# Patient Record
Sex: Female | Born: 1955 | Race: Black or African American | Hispanic: No | Marital: Single | Smoking: Never smoker
Health system: Southern US, Community
[De-identification: ages and names within clinical notes are randomized; demographics above are authoritative.]

## PROBLEM LIST (undated history)

## (undated) DIAGNOSIS — E669 Obesity, unspecified: Secondary | ICD-10-CM

## (undated) HISTORY — PX: KNEE SURGERY: SHX244

---

## 2008-09-22 ENCOUNTER — Emergency Department (HOSPITAL_COMMUNITY): Admission: EM | Admit: 2008-09-22 | Discharge: 2008-09-22 | Payer: Self-pay | Admitting: Emergency Medicine

## 2018-05-09 ENCOUNTER — Emergency Department (HOSPITAL_COMMUNITY)
Admission: EM | Admit: 2018-05-09 | Discharge: 2018-05-09 | Disposition: A | Discharge status: Home / Self Care | Payer: Managed Care, Other (non HMO) | Attending: Emergency Medicine | Admitting: Emergency Medicine

## 2018-05-09 ENCOUNTER — Encounter (HOSPITAL_COMMUNITY): Payer: Self-pay | Admitting: *Deleted

## 2018-05-09 ENCOUNTER — Other Ambulatory Visit: Payer: Self-pay

## 2018-05-09 DIAGNOSIS — Z23 Encounter for immunization: Secondary | ICD-10-CM | POA: Diagnosis not present

## 2018-05-09 DIAGNOSIS — T63461A Toxic effect of venom of wasps, accidental (unintentional), initial encounter: Secondary | ICD-10-CM | POA: Diagnosis present

## 2018-05-09 DIAGNOSIS — T63441A Toxic effect of venom of bees, accidental (unintentional), initial encounter: Secondary | ICD-10-CM

## 2018-05-09 HISTORY — DX: Obesity, unspecified: E66.9

## 2018-05-09 MED ORDER — AMOXICILLIN-POT CLAVULANATE 875-125 MG PO TABS: 1.0000 | ORAL_TABLET | Freq: Two times a day (BID) | ORAL | 0 refills | Status: AC

## 2018-05-09 MED ORDER — TETANUS-DIPHTH-ACELL PERTUSSIS 5-2.5-18.5 LF-MCG/0.5 IM SUSP
0.5000 mL | Freq: Once | INTRAMUSCULAR | Status: AC
  Administered 2018-05-09: 0.5 mL via INTRAMUSCULAR
  Filled 2018-05-09: qty 0.5

## 2018-05-09 NOTE — ED Triage Notes (Signed)
Pt reports that she was mowing her lawn when she hit a jellow jackets nest and she was then stung on right eyelid.  Pt denies any changes in her vision.  Some swelling noted to right eye.  No sob with this.

## 2018-05-09 NOTE — Discharge Instructions (Signed)
Use the Augmentin only if your eye becomes hot, red, swollen and far more painful or you have fever, shaking chills. Contact a health care provider if: Your symptoms do not get better in 2-3 days. You have redness, swelling, or pain that spreads beyond the area of the sting. You have a fever. Get help right away if: You have symptoms of a severe allergic reaction. These include: Wheezing or difficulty breathing. Tightness in the chest or chest pain. Light-headedness or fainting. Itchy, raised, red patches on the skin. Nausea or vomiting. Abdominal cramping. Diarrhea. A swollen tongue or lips, or trouble swallowing. Dizziness or fainting.

## 2018-05-09 NOTE — ED Provider Notes (Signed)
[facility] EMERGENCY DEPARTMENT Provider Note   CSN: [id number] Arrival date & time: 05/09/18  1835     History   Chief Complaint Chief Complaint  Patient presents with  . Insect Bite    HPI Amy Malone is a 62 y.o. female who presents to the emergency department for evaluation of yellowjacket sting to the right eye.  Patient states she was mowing her lawn today when she was swarmed by yellow jacket after she ran over the nest.  She was stung on the right eyelid.  She states that she felt something hard along her eyelash line and thought she may have a retained stinger and came to the emergency department for evaluation.  She is unsure of her last tetanus vaccination.  She denies any pain in her eye itself.  She denies pain with eye movement.    HPI  Past Medical History:  Diagnosis Date  . Obesity     There are no active problems to display for this patient.   Past Surgical History:  Procedure Laterality Date  . KNEE SURGERY       OB History   None      Home Medications    Prior to Admission medications   Not on File    Family History No family history on file.  Social History Social History   Tobacco Use  . Smoking status: Never Smoker  . Smokeless tobacco: Never Used  Substance Use Topics  . Alcohol use: Yes  . Drug use: Never     Allergies   Patient has no known allergies.   Review of Systems Review of Systems Ten systems reviewed and are negative for acute change, except as noted in the HPI.    Physical Exam Updated Vital Signs BP 136/90 (BP Location: Right Arm)   Pulse (!) 106   Temp 98.2 F (36.8 C) (Oral)   Resp 18   Ht 5\' 6"  (1.676 m)   Wt 108.9 kg   SpO2 96%   BMI 38.74 kg/m   Physical Exam  Constitutional: She is oriented to person, place, and time. She appears well-developed and well-nourished. No distress.  HENT:  Head: Normocephalic and atraumatic.  Eyes: Pupils are equal, round, and reactive  to light. Conjunctivae and EOM are normal. No scleral icterus.  Retained stinger in the right eyelid along the lash margin Full range of motion of the eye without pain visual acuity documented in nursing notes and is at baseline.  Neck: Normal range of motion.  Cardiovascular: Normal rate, regular rhythm and normal heart sounds. Exam reveals no gallop and no friction rub.  No murmur heard. Pulmonary/Chest: Effort normal and breath sounds normal. No respiratory distress.  Abdominal: Soft. Bowel sounds are normal. She exhibits no distension and no mass. There is no tenderness. There is no guarding.  Neurological: She is alert and oriented to person, place, and time.  Skin: Skin is warm and dry. She is not diaphoretic.  Psychiatric: Her behavior is normal.  Nursing note and vitals reviewed.    ED Treatments / Results  Labs (all labs ordered are listed, but only abnormal results are displayed) Labs Reviewed - No data to display  EKG None  Radiology No results found.  Procedures .Foreign Body Removal Date/Time: 05/09/2018 7:27 PM Performed by: Arthor Captain, PA-C Authorized by: Arthor Captain, PA-C  Consent: Verbal consent obtained. Consent given by: patient Patient understanding: patient states understanding of the procedure being performed Body area: eye Location details:  right eyelid Patient cooperative: yes Localization method: visualized Removal mechanism: tweezers. Depth: superficial Complexity: simple 1 objects recovered. Objects recovered: wasp stinger Post-procedure assessment: foreign body removed Patient tolerance: Patient tolerated the procedure well with no immediate complications   (including critical care time)  Medications Ordered in ED Medications  Tdap (BOOSTRIX) injection 0.5 mL (has no administration in time range)     Initial Impression / Assessment and Plan / ED Course  I have reviewed the triage vital signs and the nursing notes.  Pertinent  labs & imaging results that were available during my care of the patient were reviewed by me and considered in my medical decision making (see chart for details).    With wasp sting to the eye.  Stinger removed.  She has minimal swelling and only a little bit of tenderness in the eye.  EOMI without pain.  Her visual acuity is at baseline.  Tetanus vaccination updated.  Patient will be given Augmentin to hold if she should have signs of infection which we personally reviewed and includes worsening pain swelling, pain out of proportion to the initial injury, fever or shaking chills.  Final Clinical Impressions(s) / ED Diagnoses   Final diagnoses:  Bee sting, accidental or unintentional, initial encounter    ED Discharge Orders    None       Arthor Captain, PA-C 05/09/18 1929    Pricilla Loveless, MD 05/10/18 [phone]

## 2018-05-09 NOTE — ED Notes (Signed)
Patient verbalizes understanding of discharge instructions. Opportunity for questioning and answers were provided. Armband removed by staff, pt discharged from ED ambulatory.   

## 2019-10-15 ENCOUNTER — Ambulatory Visit (INDEPENDENT_AMBULATORY_CARE_PROVIDER_SITE_OTHER): Payer: Managed Care, Other (non HMO)

## 2019-10-15 ENCOUNTER — Ambulatory Visit (HOSPITAL_COMMUNITY)
Admission: EM | Admit: 2019-10-15 | Discharge: 2019-10-15 | Disposition: A | Discharge status: Home / Self Care | Payer: Managed Care, Other (non HMO) | Attending: Emergency Medicine | Admitting: Emergency Medicine

## 2019-10-15 ENCOUNTER — Other Ambulatory Visit: Payer: Self-pay

## 2019-10-15 ENCOUNTER — Encounter (HOSPITAL_COMMUNITY): Payer: Self-pay | Admitting: Emergency Medicine

## 2019-10-15 DIAGNOSIS — S92324A Nondisplaced fracture of second metatarsal bone, right foot, initial encounter for closed fracture: Secondary | ICD-10-CM

## 2019-10-15 DIAGNOSIS — S92334A Nondisplaced fracture of third metatarsal bone, right foot, initial encounter for closed fracture: Secondary | ICD-10-CM

## 2019-10-15 MED ORDER — TRAMADOL HCL 50 MG PO TABS: 50.0000 mg | ORAL_TABLET | Freq: Three times a day (TID) | ORAL | 0 refills | Status: AC | PRN

## 2019-10-15 MED ORDER — NAPROXEN 500 MG PO TABS: 500.0000 mg | ORAL_TABLET | Freq: Two times a day (BID) | ORAL | 0 refills | Status: AC

## 2019-10-15 NOTE — Discharge Instructions (Addendum)
You have two metatarsal fractures seen on xray, consistent with where you are experiencing pain.  Ice, elevation, use of ace wrap and the shoe we have provided you - especially if ambulating- to help with pain.  Naproxen twice a day to help with swelling, take with food.  Tramadol for breakthrough pain. May cause drowsiness. Please do not take if driving or drinking alcohol.   Follow up with orthopedics for definitive evaluation.

## 2019-10-15 NOTE — ED Triage Notes (Signed)
Pt states she tripped down the stairs and twisted her R foot. Pt states she cannot put weight on her R foot. Obvious swelling to lower R leg.

## 2019-10-16 ENCOUNTER — Ambulatory Visit: Payer: Managed Care, Other (non HMO) | Admitting: Physician Assistant

## 2019-10-16 ENCOUNTER — Encounter: Payer: Self-pay | Admitting: Physician Assistant

## 2019-10-16 DIAGNOSIS — S92301A Fracture of unspecified metatarsal bone(s), right foot, initial encounter for closed fracture: Secondary | ICD-10-CM | POA: Diagnosis not present

## 2019-10-16 NOTE — ED Provider Notes (Signed)
[facility]    CSN: [id number] Arrival date & time: 10/15/19  1603      History   Chief Complaint Chief Complaint  Patient presents with  . Foot Pain    HPI Amy Malone is a 64 y.o. female.   Amy Malone presents with complaints of right foot pain and swelling after she tripped/ mis-stepped down a step yesterday afternoon. Unable to bear weight on it since due to pain. Denies any previous injury to the foot. No numbness or tingling. Hasn't taken any medications for her pain.    ROS per HPI, negative if not otherwise mentioned.      Past Medical History:  Diagnosis Date  . Obesity     There are no problems to display for this patient.   Past Surgical History:  Procedure Laterality Date  . KNEE SURGERY      OB History   No obstetric history on file.      Home Medications    Prior to Admission medications   Medication Sig Start Date End Date Taking? Authorizing Provider  amoxicillin-clavulanate (AUGMENTIN) 875-125 MG tablet Take 1 tablet by mouth 2 (two) times daily. One po bid x 7 days 05/09/18   Margarita Mail, PA-C  naproxen (NAPROSYN) 500 MG tablet Take 1 tablet (500 mg total) by mouth 2 (two) times daily. 10/15/19   Zigmund Gottron, NP  traMADol (ULTRAM) 50 MG tablet Take 1 tablet (50 mg total) by mouth every 8 (eight) hours as needed. 10/15/19   Zigmund Gottron, NP    Family History History reviewed. No pertinent family history.  Social History Social History   Tobacco Use  . Smoking status: Never Smoker  . Smokeless tobacco: Never Used  Substance Use Topics  . Alcohol use: Yes  . Drug use: Never     Allergies   Patient has no known allergies.   Review of Systems Review of Systems   Physical Exam Triage Vital Signs ED Triage Vitals  Enc Vitals Group     BP 10/15/19 1725 132/90     Pulse Rate 10/15/19 1725 90     Resp 10/15/19 1725 16     Temp 10/15/19 1725 98.2 F (36.8 C)     Temp Source 10/15/19 1725 Oral     SpO2 10/15/19 1725 99 %     Weight --      Height --      Head Circumference --      Peak Flow --      Pain Score 10/15/19 1727 2     Pain Loc --      Pain Edu? --      Excl. in [place]? --    No data found.  Updated Vital Signs BP 132/90 (BP Location: Left Arm)   Pulse 90   Temp 98.2 F (36.8 C) (Oral)   Resp 16   SpO2 99%   Visual Acuity Right Eye Distance:   Left Eye Distance:   Bilateral Distance:    Right Eye Near:   Left Eye Near:    Bilateral Near:     Physical Exam Constitutional:      General: She is not in acute distress.    Appearance: She is well-developed.  Cardiovascular:     Rate and Rhythm: Normal rate.  Pulmonary:     Effort: Pulmonary effort is normal.  Musculoskeletal:     Left ankle: Swelling present. No tenderness. Normal range of motion.     Left foot:  Swelling, tenderness and bony tenderness present.     Comments: Tenderness over proximal 2-4 metatarsals with generalized swelling to foot and ankle; cap refill < 2 seconds ; full ankle ROM; pain with toe ROM  Skin:    General: Skin is warm and dry.  Neurological:     Mental Status: She is alert and oriented to person, place, and time.      UC Treatments / Results  Labs (all labs ordered are listed, but only abnormal results are displayed) Labs Reviewed - No data to display  EKG   Radiology DG Foot Complete Right  Result Date: 10/15/2019 CLINICAL DATA:  Fall down stairs.  Right foot pain EXAM: RIGHT FOOT COMPLETE - 3+ VIEW COMPARISON:  None. FINDINGS: Degenerative changes of the 1st MTP joint with hallux valgus. Soft tissue swelling diffusely throughout the right foot. Fractures noted through the bases of the 2nd and 3rd metatarsals, nondisplaced. No subluxation or dislocation. IMPRESSION: Fractures through the bases of the right 2nd and 3rd metatarsals. Electronically Signed   By: Charlett Nose M.D.   On: 10/15/2019 17:41    Procedures Procedures (including critical care  time)  Medications Ordered in UC Medications - No data to display  Initial Impression / Assessment and Plan / UC Course  I have reviewed the triage vital signs and the nursing notes.  Pertinent labs & imaging results that were available during my care of the patient were reviewed by me and considered in my medical decision making (see chart for details).     2nd and 3rd metatarsal bases with fractures, on xray. Ace wrap and post op shoe applied, crutches provided as patient unable to tolerate weight bearing at this time. Pain management discussed and encouraged ortho follow up. Patient verbalized understanding and agreeable to plan.   Final Clinical Impressions(s) / UC Diagnoses   Final diagnoses:  Closed nondisplaced fracture of second metatarsal bone of right foot, initial encounter  Closed nondisplaced fracture of third metatarsal bone of right foot, initial encounter     Discharge Instructions     You have two metatarsal fractures seen on xray, consistent with where you are experiencing pain.  Ice, elevation, use of ace wrap and the shoe we have provided you - especially if ambulating- to help with pain.  Naproxen twice a day to help with swelling, take with food.  Tramadol for breakthrough pain. May cause drowsiness. Please do not take if driving or drinking alcohol.   Follow up with orthopedics for definitive evaluation.    ED Prescriptions    Medication Sig Dispense Auth. Provider   naproxen (NAPROSYN) 500 MG tablet Take 1 tablet (500 mg total) by mouth 2 (two) times daily. 30 tablet Linus Mako B, NP   traMADol (ULTRAM) 50 MG tablet Take 1 tablet (50 mg total) by mouth every 8 (eight) hours as needed. 10 tablet Georgetta Haber, NP     I have reviewed the PDMP during this encounter.   Georgetta Haber, NP 10/16/19 1228

## 2019-10-16 NOTE — Progress Notes (Signed)
Office Visit Note   Patient: Amy Malone           Date of Birth: [birth year]           MRN: [id number] Visit Date: 10/16/2019              Requested by: No referring provider defined for this encounter. PCP: System, Pcp Not In   Assessment & Plan: Visit Diagnoses:  1. Closed nondisplaced fracture of metatarsal bone of right foot, unspecified metatarsal, initial encounter     Plan: Place her in a cam walker boot heel weightbearing.  Discussed with her that she is not to drive with the boot.  She is to treat the cam walker boot for the postop shoe as it casting her breath ambulating.  Recommend elevation wiggling of toes.  Also ice.  We will place her out of work until next Monday at her request.  She will follow-up with [place] in 2 weeks at that time we will obtain 3 views of the right foot.  Questions encouraged and answered at length.  Follow-Up Instructions: Return in about 2 weeks (around 10/30/2019) for Radiographs.   Orders:  No orders of the defined types were placed in this encounter.  No orders of the defined types were placed in this encounter.     Procedures: No procedures performed   Clinical Data: No additional findings.   Subjective: Chief Complaint  Patient presents with  . Right Foot - Fracture    HPI Amy Malone is a very pleasant 64 year old female were seen for the first time.  She unfortunately sustained a right foot fracture over the weekend.  Patient states she tripped over a threshold in her home yesterday sustained right foot injury.  She went to his: Urgent care where she was found to have second third metatarsal base fractures intra-articular nondisplaced.  She is placed in the postop shoe and told to follow-up with orthopedics.  She denies any other injury.  She did not lose consciousness.  There is no dizziness shortness of breath chest pain time of the injury. Radiographs right foot 3 views dated 10/15/2019 are reviewed personally.  These show  base of the second and third metatarsals fractures with intra-articular involvement.  Slight step-off of the third metatarsal base fracture.  No Lisfranc injuries.  No other acute fractures.  Significant hallux valgus deformity.  Review of Systems Please see HPI otherwise negative or noncontributory.  Objective: Vital Signs: There were no vitals taken for this visit.  Physical Exam General: Well-developed well-nourished female in no acute distress mood and affect appropriate Psych: Alert and oriented x3 Vascular: Right foot dorsal pedal pulse intact.  Right calf supple nontender. Ortho Exam Right foot tenderness over the base second third metatarsals.  Obvious hallux valgus deformity.  No rashes, skin lesions, impending ulcers or erythema.  Positive edema with dorsal aspect of the right foot.  Right ankle good range of motion without pain.  Achilles nontender.  Right calf supple nontender.  Sensation grossly intact throughout right foot light touch. Specialty Comments:  No specialty comments available.  Imaging: DG Foot Complete Right  Result Date: 10/15/2019 CLINICAL DATA:  Fall down stairs.  Right foot pain EXAM: RIGHT FOOT COMPLETE - 3+ VIEW COMPARISON:  None. FINDINGS: Degenerative changes of the 1st MTP joint with hallux valgus. Soft tissue swelling diffusely throughout the right foot. Fractures noted through the bases of the 2nd and 3rd metatarsals, nondisplaced. No subluxation or dislocation. IMPRESSION: Fractures through the bases of  the right 2nd and 3rd metatarsals. Electronically Signed   By: Rolm Baptise M.D.   On: 10/15/2019 17:41     PMFS History: There are no problems to display for this patient.  Past Medical History:  Diagnosis Date  . Obesity     History reviewed. No pertinent family history.  Past Surgical History:  Procedure Laterality Date  . KNEE SURGERY     Social History   Occupational History  . Not on file  Tobacco Use  . Smoking status: Never Smoker    . Smokeless tobacco: Never Used  Substance and Sexual Activity  . Alcohol use: Yes  . Drug use: Never  . Sexual activity: Not on file

## 2019-10-17 ENCOUNTER — Telehealth: Payer: Self-pay | Admitting: Physician Assistant

## 2019-10-17 ENCOUNTER — Encounter: Payer: Self-pay | Admitting: Radiology

## 2019-10-17 NOTE — Telephone Encounter (Signed)
Emailed to given address below.

## 2019-10-17 NOTE — Telephone Encounter (Signed)
Patient called.   She needs her out of work note updated and extended until her next appointment.   Call back: [phone]  Email: [email or web address]

## 2019-10-30 ENCOUNTER — Encounter: Payer: Self-pay | Admitting: Physician Assistant

## 2019-10-30 ENCOUNTER — Ambulatory Visit: Payer: Managed Care, Other (non HMO) | Admitting: Physician Assistant

## 2019-10-30 ENCOUNTER — Ambulatory Visit (INDEPENDENT_AMBULATORY_CARE_PROVIDER_SITE_OTHER): Payer: Managed Care, Other (non HMO)

## 2019-10-30 ENCOUNTER — Other Ambulatory Visit: Payer: Self-pay

## 2019-10-30 DIAGNOSIS — S92301A Fracture of unspecified metatarsal bone(s), right foot, initial encounter for closed fracture: Secondary | ICD-10-CM

## 2019-10-30 NOTE — Progress Notes (Signed)
   Office Visit Note   Patient: Amy Malone           Date of Birth: 1956-05-18           MRN: [id number] Visit Date: 10/30/2019              Requested by: No referring provider defined for this encounter. PCP: System, Pcp Not In   Assessment & Plan: Visit Diagnoses:  1. Closed nondisplaced fracture of metatarsal bone of right foot, unspecified metatarsal, initial encounter     Plan: Continue postop shoe weightbearing as tolerated right foot.  3 views of right foot on return in 1 month.  Did place her half days at work until return in 1 month.  Questions encouraged and answered.  No high impact activities.  Follow-Up Instructions: Return in about 4 weeks (around 11/27/2019) for Radiographs.   Orders:  Orders Placed This Encounter  Procedures  . XR Foot Complete Right   No orders of the defined types were placed in this encounter.     Procedures: No procedures performed   Clinical Data: No additional findings.   Subjective: Chief Complaint  Patient presents with  . Right Foot - Follow-up    HPI Amy Malone returns today just over 2 weeks status post right second and third metatarsal base fractures.  She states she is really had not a lot of pain.  She is in a postop shoe.  She does note that the foot swells whenever she is up on it throughout the day.  No complaints otherwise. Review of Systems Negative for fevers or chills  Objective: Vital Signs: There were no vitals taken for this visit.  Physical Exam General: Well-developed well-nourished female no acute distress. Ortho Exam Right foot tenderness over the second third metatarsal base.  No rashes skin lesions ulcerations.  Minimal edema.  Good range of motion the right ankle with dorsiflexion plantarflexion without pain. Specialty Comments:  No specialty comments available.  Imaging: XR Foot Complete Right  Result Date: 10/30/2019 Right foot 3 views: Second third metatarsal base fractures remain  unchanged in overall position alignment.  Early consolidation changes seen at the second and third metatarsal fractures.  No other acute findings.    PMFS History: There are no problems to display for this patient.  Past Medical History:  Diagnosis Date  . Obesity     History reviewed. No pertinent family history.  Past Surgical History:  Procedure Laterality Date  . KNEE SURGERY     Social History   Occupational History  . Not on file  Tobacco Use  . Smoking status: Never Smoker  . Smokeless tobacco: Never Used  Substance and Sexual Activity  . Alcohol use: Yes  . Drug use: Never  . Sexual activity: Not on file

## 2019-11-27 ENCOUNTER — Other Ambulatory Visit: Payer: Self-pay

## 2019-11-27 ENCOUNTER — Encounter: Payer: Self-pay | Admitting: Physician Assistant

## 2019-11-27 ENCOUNTER — Ambulatory Visit (INDEPENDENT_AMBULATORY_CARE_PROVIDER_SITE_OTHER): Payer: Managed Care, Other (non HMO)

## 2019-11-27 ENCOUNTER — Ambulatory Visit: Payer: Managed Care, Other (non HMO) | Admitting: Physician Assistant

## 2019-11-27 DIAGNOSIS — S92301A Fracture of unspecified metatarsal bone(s), right foot, initial encounter for closed fracture: Secondary | ICD-10-CM | POA: Diagnosis not present

## 2019-11-27 NOTE — Progress Notes (Signed)
   Office Visit Note   Patient: Amy Malone           Date of Birth: Apr 13, 1956           MRN: [id number] Visit Date: 11/27/2019              Requested by: No referring provider defined for this encounter. PCP: System, Pcp Not In   Assessment & Plan: Visit Diagnoses:  1. Closed nondisplaced fracture of metatarsal bone of right foot, unspecified metatarsal, initial encounter     Plan: She will continue to weight-bear in the postop shoe for the next 2 weeks weightbearing as tolerated.  Then transition to a regular shoe.  We will see her back in a month obtain 3 views of her right foot.  Questions encouraged and answered.  She is to avoid any high impact activities or prolonged walking for exercise.  We will keep her out of of work full time only doing half days for the next month.  Follow-Up Instructions: Return in about 4 weeks (around 12/25/2019) for Radiographs.   Orders:  Orders Placed This Encounter  Procedures  . XR Foot Complete Right   No orders of the defined types were placed in this encounter.     Procedures: No procedures performed   Clinical Data: No additional findings.   Subjective: Chief Complaint  Patient presents with  . Right Foot - Follow-up, Fracture    HPI Amy Malone returns today for follow-up of her right foot.  She has now 4 weeks status post closed nondisplaced fracture involving the second third metatarsal proximal shafts.  States overall that her pain is diminishing.  She also notes her swelling has gotten better.  She still using a crutch to ambulate and is in a postop shoe. Review of Systems See HPI  Objective: Vital Signs: There were no vitals taken for this visit.  Physical Exam Constitutional:      Appearance: She is not ill-appearing or diaphoretic.  Neurological:     Mental Status: She is alert and oriented to person, place, and time.  Psychiatric:        Mood and Affect: Mood normal.     Ortho Exam Right foot slight  tenderness over the second third metatarsal shafts proximally.  Slight edema of the right foot.  Right calf supple nontender.  There is no rashes skin lesions ulcerations or impending ulcers. Specialty Comments:  No specialty comments available.  Imaging: XR Foot Complete Right  Result Date: 11/27/2019 Right foot 3 views: Second and third metatarsal shaft fractures remain unchanged in overall position alignment.  Some early signs of consolidation.  No other fractures identified.    PMFS History: There are no problems to display for this patient.  Past Medical History:  Diagnosis Date  . Obesity     History reviewed. No pertinent family history.  Past Surgical History:  Procedure Laterality Date  . KNEE SURGERY     Social History   Occupational History  . Not on file  Tobacco Use  . Smoking status: Never Smoker  . Smokeless tobacco: Never Used  Substance and Sexual Activity  . Alcohol use: Yes  . Drug use: Never  . Sexual activity: Not on file

## 2019-12-25 ENCOUNTER — Ambulatory Visit (INDEPENDENT_AMBULATORY_CARE_PROVIDER_SITE_OTHER): Payer: Managed Care, Other (non HMO)

## 2019-12-25 ENCOUNTER — Encounter: Payer: Self-pay | Admitting: Physician Assistant

## 2019-12-25 ENCOUNTER — Ambulatory Visit: Payer: Managed Care, Other (non HMO) | Admitting: Physician Assistant

## 2019-12-25 ENCOUNTER — Other Ambulatory Visit: Payer: Self-pay

## 2019-12-25 DIAGNOSIS — S92301A Fracture of unspecified metatarsal bone(s), right foot, initial encounter for closed fracture: Secondary | ICD-10-CM

## 2019-12-25 NOTE — Progress Notes (Signed)
   Office Visit Note   Patient: Amy Malone           Date of Birth: 01-12-1956           MRN: [id number] Visit Date: 12/25/2019              Requested by: No referring provider defined for this encounter. PCP: System, Pcp Not In   Assessment & Plan: Visit Diagnoses:  1. Closed nondisplaced fracture of metatarsal bone of right foot, unspecified metatarsal, initial encounter     Plan: Recommend no jumping or running for the next 4 to 6 weeks.  Patient would like repeat radiographs in 4 weeks to see the consolidation to be complete of the second metacarpal base fracture.  She will continue her regular shoe as comfort allows.  Questions were encouraged and answered at length.  Follow-Up Instructions: Return in about 4 weeks (around 01/22/2020).   Orders:  Orders Placed This Encounter  Procedures  . XR Foot Complete Right   No orders of the defined types were placed in this encounter.     Procedures: No procedures performed   Clinical Data: No additional findings.   Subjective: Chief Complaint  Patient presents with  . Right Foot - Follow-up    HPI Amy Malone returns 8 weeks status post right foot second third metatarsal base fractures.  She is trending towards improvement.  She started wearing a regular shoe last week.  Is having no pain some swelling end of the day no. Review of Systems See HPI otherwise negative noncontributory  Objective: Vital Signs: There were no vitals taken for this visit.  Physical Exam Constitutional:      Appearance: She is not ill-appearing or diaphoretic.  Neurological:     Mental Status: She is alert and oriented to person, place, and time.  Psychiatric:        Mood and Affect: Mood normal.     Ortho Exam Right foot dorsal pulse present.  No rashes skin lesions ulcerations erythema or significant edema.  Nontender over the base second third metatarsals.  Positive hallux valgus deformity.  Callus under the second metatarsal  head. Specialty Comments:  No specialty comments available.  Imaging: XR Foot Complete Right  Result Date: 12/25/2019 3 views right foot: Second third metatarsal fractures with good consolidation.  Otherwise no acute findings.  Fractures remain in good position alignment.  Third metatarsal fracture appears well-healed.  Second metatarsal fracture slight lucency.    PMFS History: There are no problems to display for this patient.  Past Medical History:  Diagnosis Date  . Obesity     History reviewed. No pertinent family history.  Past Surgical History:  Procedure Laterality Date  . KNEE SURGERY     Social History   Occupational History  . Not on file  Tobacco Use  . Smoking status: Never Smoker  . Smokeless tobacco: Never Used  Substance and Sexual Activity  . Alcohol use: Yes  . Drug use: Never  . Sexual activity: Not on file

## 2020-01-22 ENCOUNTER — Ambulatory Visit: Payer: Managed Care, Other (non HMO) | Admitting: Physician Assistant

## 2021-03-31 IMAGING — DX DG FOOT COMPLETE 3+V*R*
3 series · 3 of 3 positions shown · non-contrast
Comparison: None.

CLINICAL DATA: Fall down stairs.  Right foot pain

EXAM:
RIGHT FOOT COMPLETE - 3+ VIEW

[foot ap]
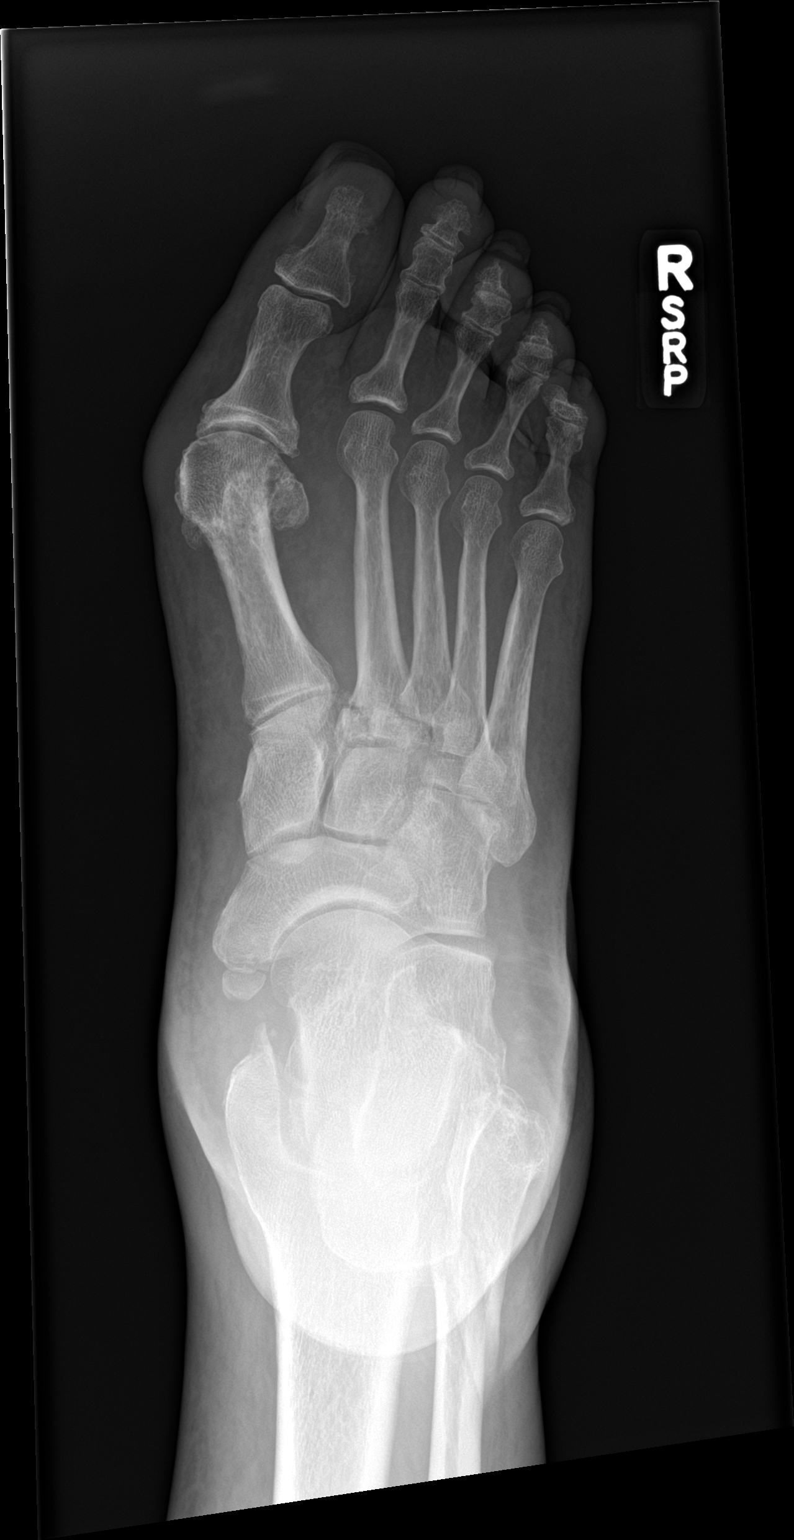

[foot obl]
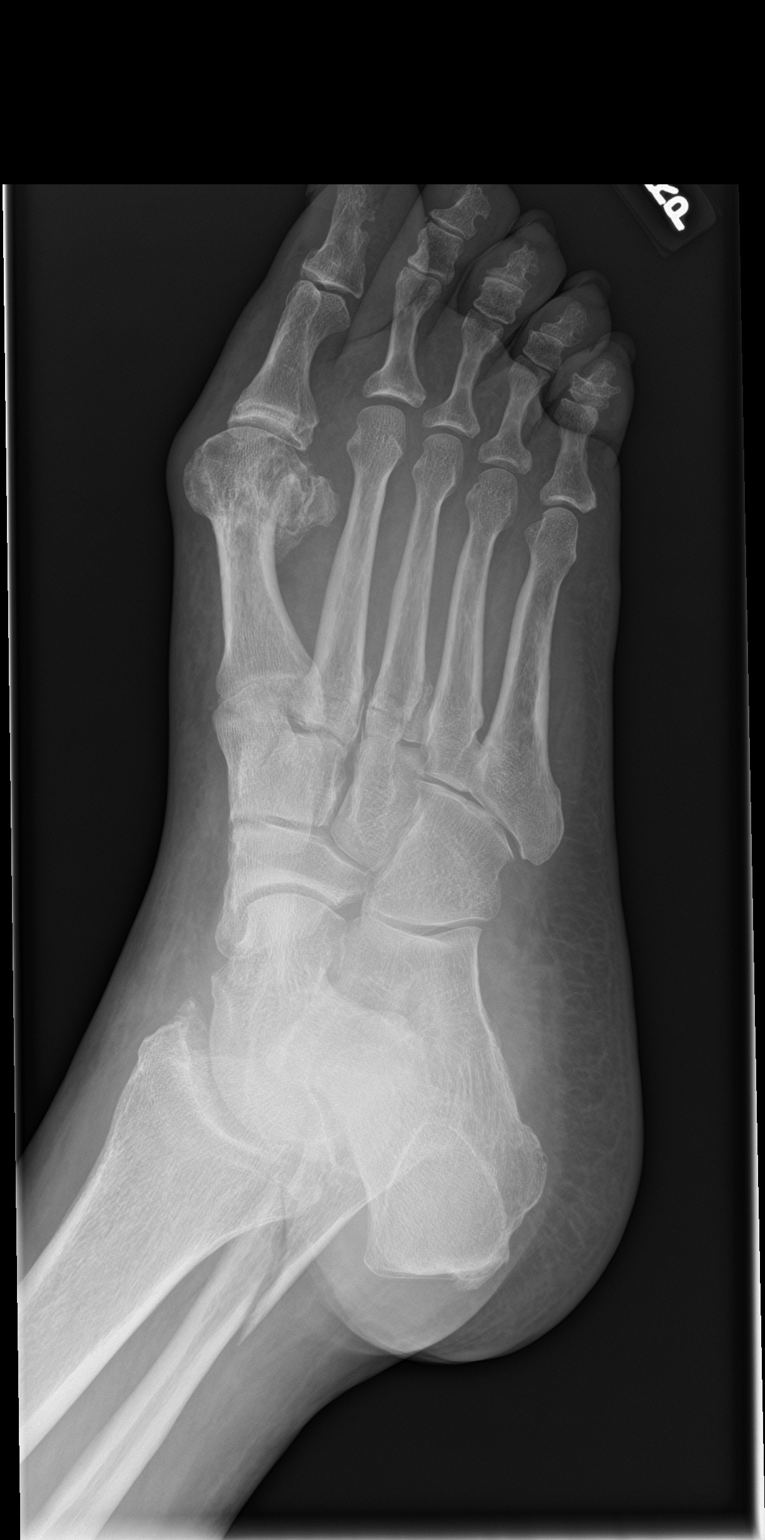

[foot lat]
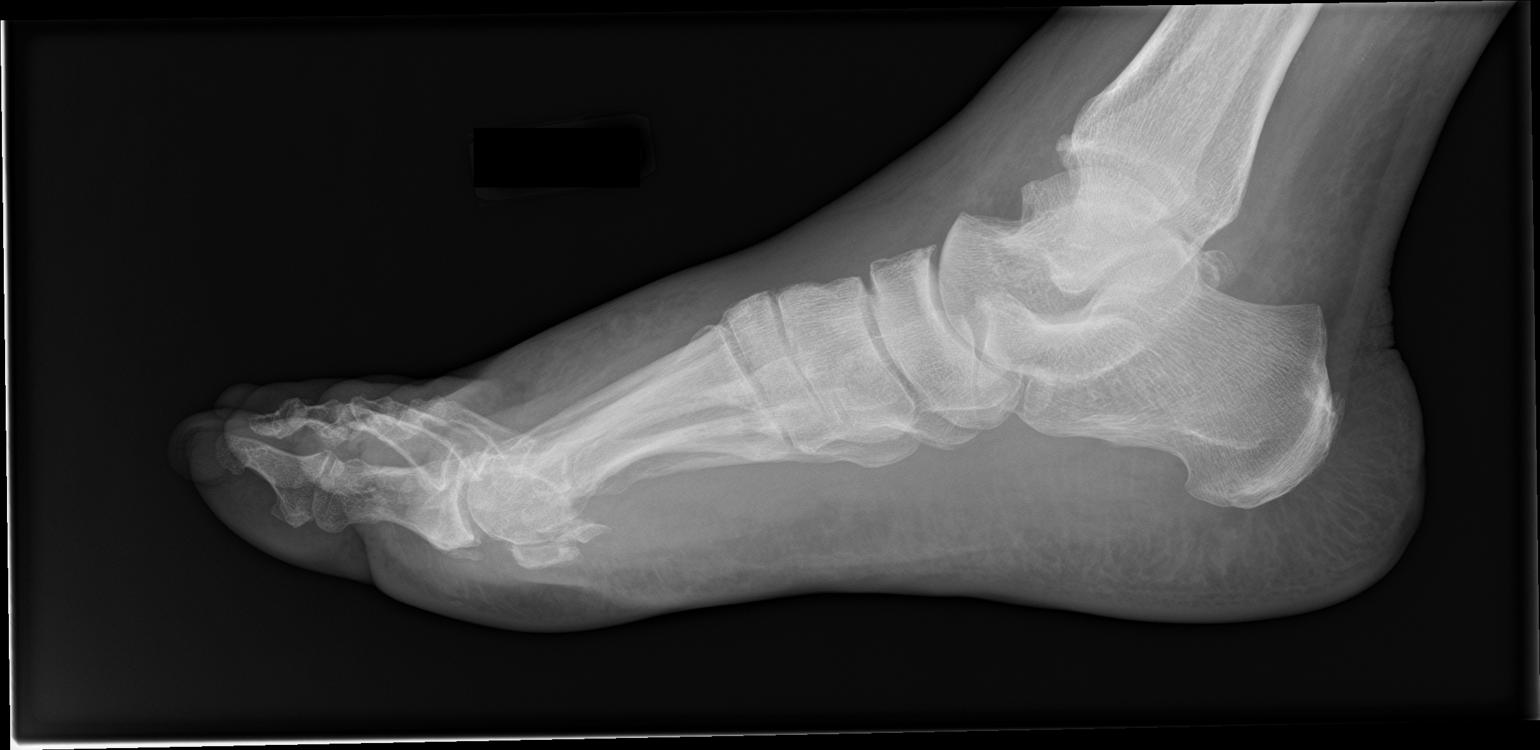

[3 of 3 positions shown; findings below may reference images not displayed]

FINDINGS: Degenerative changes of the 1st MTP joint with hallux valgus. Soft
tissue swelling diffusely throughout the right foot. Fractures noted
through the bases of the 2nd and 3rd metatarsals, nondisplaced. No
subluxation or dislocation.
IMPRESSION: Fractures through the bases of the right 2nd and 3rd metatarsals.
